# Patient Record
Sex: Female | Born: 1985 | Race: White | Hispanic: No | Marital: Single | Smoking: Current every day smoker
Health system: Southern US, Community
[De-identification: ages and names within clinical notes are randomized; demographics above are authoritative.]

---

## 2014-03-12 ENCOUNTER — Encounter (HOSPITAL_COMMUNITY): Payer: Self-pay | Admitting: Emergency Medicine

## 2014-03-12 ENCOUNTER — Emergency Department (HOSPITAL_COMMUNITY): Payer: 59

## 2014-03-12 ENCOUNTER — Emergency Department (HOSPITAL_COMMUNITY)
Admission: EM | Admit: 2014-03-12 | Discharge: 2014-03-12 | Disposition: A | Discharge status: Home / Self Care | Payer: 59 | Attending: Emergency Medicine | Admitting: Emergency Medicine

## 2014-03-12 DIAGNOSIS — H538 Other visual disturbances: Secondary | ICD-10-CM | POA: Diagnosis not present

## 2014-03-12 DIAGNOSIS — Z3202 Encounter for pregnancy test, result negative: Secondary | ICD-10-CM | POA: Insufficient documentation

## 2014-03-12 DIAGNOSIS — Z720 Tobacco use: Secondary | ICD-10-CM | POA: Insufficient documentation

## 2014-03-12 DIAGNOSIS — Z79899 Other long term (current) drug therapy: Secondary | ICD-10-CM | POA: Diagnosis not present

## 2014-03-12 DIAGNOSIS — R531 Weakness: Secondary | ICD-10-CM | POA: Diagnosis not present

## 2014-03-12 DIAGNOSIS — R42 Dizziness and giddiness: Secondary | ICD-10-CM | POA: Diagnosis present

## 2014-03-12 LAB — BASIC METABOLIC PANEL
Anion gap: 8 (ref 5–15)
BUN: 5 mg/dL — AB (ref 6–23)
CHLORIDE: 102 meq/L (ref 96–112)
CO2: 28 mmol/L (ref 19–32)
Calcium: 9.9 mg/dL (ref 8.4–10.5)
Creatinine, Ser: 0.58 mg/dL (ref 0.50–1.10)
GFR calc Af Amer: >90 mL/min (ref >90)
GFR calc non Af Amer: >90 mL/min (ref >90)
Glucose, Bld: 89 mg/dL (ref 70–99)
POTASSIUM: 3.3 mmol/L — AB (ref 3.5–5.1)
Sodium: 138 mmol/L (ref 135–145)

## 2014-03-12 LAB — CBC
HEMATOCRIT: 47.1 % — AB (ref 36.0–46.0)
Hemoglobin: 15.9 g/dL — ABNORMAL HIGH (ref 12.0–15.0)
MCH: 30.9 pg (ref 26.0–34.0)
MCHC: 33.8 g/dL (ref 30.0–36.0)
MCV: 91.5 fL (ref 78.0–100.0)
Platelets: 287 10*3/uL (ref 150–400)
RBC: 5.15 MIL/uL — ABNORMAL HIGH (ref 3.87–5.11)
RDW: 11.9 % (ref 11.5–15.5)
WBC: 8.2 10*3/uL (ref 4.0–10.5)

## 2014-03-12 LAB — POC URINE PREG, ED: Preg Test, Ur: NEGATIVE

## 2014-03-12 LAB — CBG MONITORING, ED: GLUCOSE-CAPILLARY: 76 mg/dL (ref 70–99)

## 2014-03-12 MED ORDER — SODIUM CHLORIDE 0.9 % IV BOLUS (SEPSIS)
1000.0000 mL | Freq: Once | INTRAVENOUS | Status: AC
  Administered 2014-03-12: 1000 mL via INTRAVENOUS

## 2014-03-12 NOTE — ED Provider Notes (Signed)
CSN: [id number]     Arrival date & time 03/12/14  2016 History   First MD Initiated Contact with Patient 03/12/14 2135     Chief Complaint  Patient presents with  . Dizziness  . Weakness     (Consider location/radiation/quality/duration/timing/severity/associated sxs/prior Treatment) HPI Caitlin Dunn is a 28 y.o. female with no significant past medical history who comes in for evaluation of generalized weakness. Patient states she has felt generally weak for the past week. She reports intermittent blurry vision that has been improving in the ED. She reports recently starting a new oral contraceptive and that her most recent period lasted 2 weeks, which is unusual for her. She reports the last day of her last menses was Christmas Eve. She denies any other symptoms at this time. There are no aggravating or relieving factors. No other modifying factors. Denies dizziness, headache, chest pain, shortness of breath, abdominal pain, numbness or tingling. Patient does smoke half a pack a day for the past 6 years. Denies alcohol or illicit drug use. Reports her primary care is Jacqualine CodeRachel Chapman  History reviewed. No pertinent past medical history. History reviewed. No pertinent past surgical history. History reviewed. No pertinent family history. History  Substance Use Topics  . Smoking status: Current Every Day Smoker  . Smokeless tobacco: Not on file  . Alcohol Use: Yes   OB History    No data available     Review of Systems  Constitutional: Negative for fever.  HENT: Negative for sore throat.   Eyes: Positive for visual disturbance.  Respiratory: Negative for shortness of breath.   Cardiovascular: Negative for chest pain.  Gastrointestinal: Negative for nausea, vomiting and abdominal pain.  Endocrine: Negative for polyuria.  Genitourinary: Negative for dysuria.  Skin: Negative for rash.  Neurological: Positive for weakness. Negative for headaches.      Allergies  Review of  patient's allergies indicates no known allergies.  Home Medications   Prior to Admission medications   Medication Sig Start Date End Date Taking? Authorizing Provider  amphetamine-dextroamphetamine (ADDERALL XR) 20 MG 24 hr capsule Take 20 mg by mouth daily.   Yes Historical Provider, MD  Norgestimate-Ethinyl Estradiol Triphasic (ORTHO TRI-CYCLEN, 28,) 0.18/0.215/0.25 MG-35 MCG tablet Take 1 tablet by mouth daily.   Yes Historical Provider, MD  amphetamine-dextroamphetamine (ADDERALL XR) 25 MG 24 hr capsule Take 25 mg by mouth every morning.    Historical Provider, MD   BP 102/70 mmHg  Pulse 63  Temp(Src) 98 F (36.7 C) (Oral)  Resp 16  SpO2 100%  LMP 03/05/2014 (Exact Date) Physical Exam  Constitutional: She is oriented to person, place, and time. She appears well-developed and well-nourished.  HENT:  Head: Normocephalic and atraumatic.  Mouth/Throat: Oropharynx is clear and moist.  Eyes: Conjunctivae are normal. Pupils are equal, round, and reactive to light. Right eye exhibits no discharge. Left eye exhibits no discharge. No scleral icterus.  Neck: Neck supple.  Cardiovascular: Normal rate, regular rhythm and normal heart sounds.   Pulmonary/Chest: Effort normal and breath sounds normal. No respiratory distress. She has no wheezes. She has no rales.  Abdominal: Soft. There is no tenderness.  Musculoskeletal: Normal range of motion. She exhibits no edema or tenderness.  Neurological: She is alert and oriented to person, place, and time.  Cranial Nerves II-XII grossly intact. Motor and sensation 5/5 in all 4 extremities. No nystagmus. Patient completes finger to nose with no difficulty Gait appears baseline with no appreciable ataxia.  Skin: Skin is warm and dry.  No rash noted.  Psychiatric: She has a normal mood and affect.  Nursing note and vitals reviewed.   ED Course  Procedures (including critical care time) Labs Review Labs Reviewed  CBC - Abnormal; Notable for the  following:    RBC 5.15 (*)    Hemoglobin 15.9 (*)    HCT 47.1 (*)    All other components within normal limits  BASIC METABOLIC PANEL - Abnormal; Notable for the following:    Potassium 3.3 (*)    BUN 5 (*)    All other components within normal limits  CBG MONITORING, ED  POC URINE PREG, ED    Imaging Review Ct Head Wo Contrast  03/12/2014   CLINICAL DATA:  Headache, intermittent dizziness  EXAM: CT HEAD WITHOUT CONTRAST  TECHNIQUE: Contiguous axial images were obtained from the base of the skull through the vertex without intravenous contrast.  COMPARISON:  None.  FINDINGS: No evidence of parenchymal hemorrhage or extra-axial fluid collection. No mass lesion, mass effect, or midline shift.  No CT evidence of acute infarction.  Cerebral volume is within normal limits.  No ventriculomegaly.  Partial opacification of the bilateral ethmoid sinuses. The mastoid air cells are unopacified.  No evidence of calvarial fracture.  IMPRESSION: Normal head CT.   Electronically Signed   By: Charline BillsSriyesh  Krishnan M.D.   On: 03/12/2014 23:10     EKG Interpretation None       Date: 03/12/2014  Rate: 87  Rhythm: normal sinus rhythm  QRS Axis: normal  Intervals: normal  ST/T Wave abnormalities: normal  Conduction Disutrbances:none  Narrative Interpretation:   Old EKG Reviewed: none available   Meds given in ED:  Medications  sodium chloride 0.9 % bolus 1,000 mL (1,000 mLs Intravenous New Bag/Given 03/12/14 2251)    New Prescriptions   No medications on file   Filed Vitals:   03/12/14 2030 03/12/14 2324  BP: 129/97 102/70  Pulse: 86 63  Temp: 98 F (36.7 C)   TempSrc: Oral   Resp: 18 16  SpO2: 100% 100%    MDM  Vitals stable - WNL -afebrile Pt resting comfortably in ED. states she feels better since receiving fluids in the ED. PE--not concerning further acute or emergent pathology. Normal neuro exam. Patient gait without any ataxia. Labwork noncontributory. EKG not  concerning Imaging--normal head CT. DDX--no apparent emergent cause for fatigue symptoms, no evidence of anemia, no evidence of central lesion  Discussed f/u with PCP and return precautions, pt very amenable to plan. Prior to patient discharge, I discussed and reviewed this case with Dr.Zammit Patient stable, in good condition and is appropriate for discharge  Final diagnoses:  Weakness        Sharlene MottsBenjamin W Wilberth Damon, PA-C 03/12/14 2344  Benny LennertJoseph L Zammit, MD 03/13/14 [phone]

## 2014-03-12 NOTE — ED Notes (Signed)
Pt arrived to the ED with a complaint of dizziness and weakness for several days.  [facility] here due to vision changes and abnormalities.  Pt is unbarred to see more that three feet.  Pt states today she was unable to lift her head or open her eyes due to weakness.

## 2014-03-12 NOTE — Discharge Instructions (Signed)
Fatigue Fatigue is a feeling of tiredness, lack of energy, lack of motivation, or feeling tired all the time. Having enough rest, good nutrition, and reducing stress will normally reduce fatigue. Consult your caregiver if it persists. The nature of your fatigue will help your caregiver to find out its cause. The treatment is based on the cause.  CAUSES  There are many causes for fatigue. Most of the time, fatigue can be traced to one or more of your habits or routines. Most causes fit into one or more of three general areas. They are: Lifestyle problems  Sleep disturbances.  Overwork.  Physical exertion.  Unhealthy habits.  Poor eating habits or eating disorders.  Alcohol and/or drug use .  Lack of proper nutrition (malnutrition). Psychological problems  Stress and/or anxiety problems.  Depression.  Grief.  Boredom. Medical Problems or Conditions  Anemia.  Pregnancy.  Thyroid gland problems.  Recovery from major surgery.  Continuous pain.  Emphysema or asthma that is not well controlled  Allergic conditions.  Diabetes.  Infections (such as mononucleosis).  Obesity.  Sleep disorders, such as sleep apnea.  Heart failure or other heart-related problems.  Cancer.  Kidney disease.  Liver disease.  Effects of certain medicines such as antihistamines, cough and cold remedies, prescription pain medicines, heart and blood pressure medicines, drugs used for treatment of cancer, and some antidepressants. SYMPTOMS  The symptoms of fatigue include:   Lack of energy.  Lack of drive (motivation).  Drowsiness.  Feeling of indifference to the surroundings. DIAGNOSIS  The details of how you feel help guide your caregiver in finding out what is causing the fatigue. You will be asked about your present and past health condition. It is important to review all medicines that you take, including prescription and non-prescription items. A thorough exam will be done.  You will be questioned about your feelings, habits, and normal lifestyle. Your caregiver may suggest blood tests, urine tests, or other tests to look for common medical causes of fatigue.  TREATMENT  Fatigue is treated by correcting the underlying cause. For example, if you have continuous pain or depression, treating these causes will improve how you feel. Similarly, adjusting the dose of certain medicines will help in reducing fatigue.  HOME CARE INSTRUCTIONS   Try to get the required amount of good sleep every night.  Eat a healthy and nutritious diet, and drink enough water throughout the day.  Practice ways of relaxing (including yoga or meditation).  Exercise regularly.  Make plans to change situations that cause stress. Act on those plans so that stresses decrease over time. Keep your work and personal routine reasonable.  Avoid street drugs and minimize use of alcohol.  Start taking a daily multivitamin after consulting your caregiver. SEEK MEDICAL CARE IF:   You have persistent tiredness, which cannot be accounted for.  You have fever.  You have unintentional weight loss.  You have headaches.  You have disturbed sleep throughout the night.  You are feeling sad.  You have constipation.  You have dry skin.  You have gained weight.  You are taking any new or different medicines that you suspect are causing fatigue.  You are unable to sleep at night.  You develop any unusual swelling of your legs or other parts of your body. SEEK IMMEDIATE MEDICAL CARE IF:   You are feeling confused.  Your vision is blurred.  You feel faint or pass out.  You develop severe headache.  You develop severe abdominal, pelvic, or  back pain.  You develop chest pain, shortness of breath, or an irregular or fast heartbeat.  You are unable to pass a normal amount of urine.  You develop abnormal bleeding such as bleeding from the rectum or you vomit blood.  You have thoughts  about harming yourself or committing suicide.  You are worried that you might harm someone else. MAKE SURE YOU:   Understand these instructions.  Will watch your condition.  Will get help right away if you are not doing well or get worse. Document Released: 12/28/2006 Document Revised: 05/25/2011 Document Reviewed: 07/04/2013 [facility] Patient Information 2015 [employer] [place]. This information is not intended to replace advice given to you by your health care provider. Make sure you discuss any questions you have with your health care provider.   You were evaluated in the ED today for your generalized weakness. There does not appear to be an emergent cause for your symptoms at this time. It is important to follow up with primary care for further evaluation and management of your symptoms. Return to ED for worsening symptoms, headaches, chest pain, shortness of breath, loss of consciousness.

## 2014-10-16 ENCOUNTER — Emergency Department (HOSPITAL_COMMUNITY)
Admission: EM | Admit: 2014-10-16 | Discharge: 2014-10-16 | Disposition: A | Discharge status: Home / Self Care | Payer: 59 | Attending: Emergency Medicine | Admitting: Emergency Medicine

## 2014-10-16 ENCOUNTER — Encounter (HOSPITAL_COMMUNITY): Payer: Self-pay | Admitting: Emergency Medicine

## 2014-10-16 DIAGNOSIS — Z3202 Encounter for pregnancy test, result negative: Secondary | ICD-10-CM | POA: Diagnosis not present

## 2014-10-16 DIAGNOSIS — Z720 Tobacco use: Secondary | ICD-10-CM | POA: Diagnosis not present

## 2014-10-16 DIAGNOSIS — R109 Unspecified abdominal pain: Secondary | ICD-10-CM | POA: Diagnosis present

## 2014-10-16 DIAGNOSIS — N390 Urinary tract infection, site not specified: Secondary | ICD-10-CM | POA: Insufficient documentation

## 2014-10-16 LAB — URINE MICROSCOPIC-ADD ON

## 2014-10-16 LAB — COMPREHENSIVE METABOLIC PANEL
ALT: 13 U/L — ABNORMAL LOW (ref 14–54)
AST: 19 U/L (ref 15–41)
Albumin: 3.9 g/dL (ref 3.5–5.0)
Alkaline Phosphatase: 51 U/L (ref 38–126)
Anion gap: 10 (ref 5–15)
BILIRUBIN TOTAL: 0.6 mg/dL (ref 0.3–1.2)
BUN: 6 mg/dL (ref 6–20)
CHLORIDE: 99 mmol/L — AB (ref 101–111)
CO2: 26 mmol/L (ref 22–32)
Calcium: 9.1 mg/dL (ref 8.9–10.3)
Creatinine, Ser: 0.73 mg/dL (ref 0.44–1.00)
GFR calc non Af Amer: >60 mL/min (ref >60)
Glucose, Bld: 112 mg/dL — ABNORMAL HIGH (ref 65–99)
Potassium: 3.5 mmol/L (ref 3.5–5.1)
Sodium: 135 mmol/L (ref 135–145)
Total Protein: 7.4 g/dL (ref 6.5–8.1)

## 2014-10-16 LAB — CBC WITH DIFFERENTIAL/PLATELET
Basophils Absolute: 0 10*3/uL (ref 0.0–0.1)
Basophils Relative: 0 % (ref 0–1)
Eosinophils Absolute: 0 10*3/uL (ref 0.0–0.7)
Eosinophils Relative: 0 % (ref 0–5)
HCT: 41.3 % (ref 36.0–46.0)
Hemoglobin: 14.4 g/dL (ref 12.0–15.0)
Lymphocytes Relative: 6 % — ABNORMAL LOW (ref 12–46)
Lymphs Abs: 0.7 10*3/uL (ref 0.7–4.0)
MCH: 30.4 pg (ref 26.0–34.0)
MCHC: 34.9 g/dL (ref 30.0–36.0)
MCV: 87.3 fL (ref 78.0–100.0)
MONO ABS: 1.1 10*3/uL — AB (ref 0.1–1.0)
MONOS PCT: 9 % (ref 3–12)
NEUTROS ABS: 10.8 10*3/uL — AB (ref 1.7–7.7)
NEUTROS PCT: 85 % — AB (ref 43–77)
PLATELETS: 201 10*3/uL (ref 150–400)
RBC: 4.73 MIL/uL (ref 3.87–5.11)
RDW: 12.2 % (ref 11.5–15.5)
WBC: 12.7 10*3/uL — ABNORMAL HIGH (ref 4.0–10.5)

## 2014-10-16 LAB — URINALYSIS, ROUTINE W REFLEX MICROSCOPIC
BILIRUBIN URINE: NEGATIVE
Glucose, UA: NEGATIVE mg/dL
Ketones, ur: 15 mg/dL — AB
Nitrite: POSITIVE — AB
PH: 6 (ref 5.0–8.0)
Protein, ur: 100 mg/dL — AB
Specific Gravity, Urine: 1.026 (ref 1.005–1.030)
Urobilinogen, UA: 0.2 mg/dL (ref 0.0–1.0)

## 2014-10-16 LAB — I-STAT CG4 LACTIC ACID, ED
LACTIC ACID, VENOUS: 0.62 mmol/L (ref 0.5–2.0)
Lactic Acid, Venous: 1.38 mmol/L (ref 0.5–2.0)

## 2014-10-16 LAB — POC URINE PREG, ED: Preg Test, Ur: NEGATIVE

## 2014-10-16 LAB — LIPASE, BLOOD: Lipase: 25 U/L (ref 22–51)

## 2014-10-16 MED ORDER — CIPROFLOXACIN HCL 500 MG PO TABS: 500.0000 mg | ORAL_TABLET | Freq: Two times a day (BID) | ORAL | Status: AC

## 2014-10-16 MED ORDER — SODIUM CHLORIDE 0.9 % IV BOLUS (SEPSIS)
1000.0000 mL | Freq: Once | INTRAVENOUS | Status: AC
  Administered 2014-10-16: 1000 mL via INTRAVENOUS

## 2014-10-16 MED ORDER — ONDANSETRON 4 MG PO TBDP: 4.0000 mg | ORAL_TABLET | Freq: Three times a day (TID) | ORAL | Status: AC | PRN

## 2014-10-16 MED ORDER — DEXTROSE 5 % IV SOLN
1.0000 g | Freq: Once | INTRAVENOUS | Status: AC
  Administered 2014-10-16: 1 g via INTRAVENOUS
  Filled 2014-10-16: qty 10

## 2014-10-16 NOTE — ED Provider Notes (Signed)
History   Chief Complaint  Patient presents with  . Flank Pain  . Fever    HPI 29 year old female past medical history as below who presents to ED for dysuria, right flank pain, fever for the past few days. Patient reports she is also nauseous but denies any vomiting. She states she has history of UTIs in the past and has prior kidney infection many years ago. Patient states she is still tolerating by mouth well. She denies any abdominal pain elsewhere. Denies any vaginal bleeding or vaginal discharge. Denies any pelvic or vaginal pain. No history of kidney stones. No treatments tried at home. Once it was gradual. No modifying factors. Severity is mild to moderate. No other associated symptoms. No other complaints at this time.  Past medical/surgical history, social history, medications, allergies and FH have been reviewed with patient and/or in documentation. Furthermore, if pt family or friend(s) present, additional historical information was obtained from them.  History reviewed. No pertinent past medical history. History reviewed. No pertinent past surgical history. History reviewed. No pertinent family history. History  Substance Use Topics  . Smoking status: Current Every Day Smoker  . Smokeless tobacco: Not on file  . Alcohol Use: Yes     Review of Systems Constitutional: + F/C, fatigue.  HENT: - congestion, -rhinorrhea, -sore throat.   Eyes: - eye pain, -visual disturbance.  Respiratory: - cough, -SOB, -hemoptysis.   Cardiovascular: - CP, -palps.  Gastrointestinal: - N/V/D, -abd pain  Genitourinary: + flank pain, +dysuria, +frequency.  Musculoskeletal: - myalgia/arthritis, -joint swelling, -gait abnormality, -back pain, -neck pain/stiffness, -leg pain/swelling.  Skin: - rash/lesion.  Neurological: - focal weakness, -lightheadedness, -dizziness, -numbness, -HA.  All other systems reviewed and are negative.   Physical Exam  Physical Exam  ED Triage Vitals  Enc Vitals  Group     BP 10/16/14 1733 108/65 mmHg     Pulse Rate 10/16/14 1733 133     Resp 10/16/14 1733 20     Temp 10/16/14 1733 101.7 F (38.7 C)     Temp Source 10/16/14 1733 Oral     SpO2 10/16/14 1733 99 %     Weight 10/16/14 1733 138 lb (62.596 kg)     Height --      Head Cir --      Peak Flow --      Pain Score 10/16/14 1733 8     Pain Loc --      Pain Edu? --      Excl. in GC? --    Filed Vitals:   10/16/14 1733 10/16/14 2027 10/16/14 2045  BP: 108/65 108/59 118/74  Pulse: 133 83 83  Temp: 101.7 F (38.7 C) 99.1 F (37.3 C)   TempSrc: Oral Oral   Resp: 20 18   Weight: 138 lb (62.596 kg)    SpO2: 99% 99% 100%    Constitutional: Patient is well appearing and in no acute distress Head: Normocephalic and atraumatic.  Eyes: Extraocular motion intact, no scleral icterus Mouth: MMM, OP clear Neck: Supple without meningismus, mass, or overt JVD Respiratory: No respiratory distress. Normal WOB. No w/r/g. CV: RRR, no obvious murmurs.  Pulses +2 and symmetric. Euvolemic Abdomen: Soft, NT, ND, no r/g. No mass.  MSK: Extremities are atraumatic without deformity, ROM intact Skin: Warm, dry, intact without rash Neuro: AAOx4, MAE 5/5 sym, no focal deficit noted   ED Course  Procedures   Labs Reviewed  COMPREHENSIVE METABOLIC PANEL - Abnormal; Notable for the following:  Chloride 99 (*)    Glucose, Bld 112 (*)    ALT 13 (*)    All other components within normal limits  URINALYSIS, ROUTINE W REFLEX MICROSCOPIC (NOT AT [facility]) - Abnormal; Notable for the following:    Color, Urine AMBER (*)    APPearance TURBID (*)    Hgb urine dipstick MODERATE (*)    Ketones, ur 15 (*)    Protein, ur 100 (*)    Nitrite POSITIVE (*)    Leukocytes, UA SMALL (*)    All other components within normal limits  CBC WITH DIFFERENTIAL/PLATELET - Abnormal; Notable for the following:    WBC 12.7 (*)    Neutrophils Relative % 85 (*)    Neutro Abs 10.8 (*)    Lymphocytes Relative 6 (*)     Monocytes Absolute 1.1 (*)    All other components within normal limits  URINE MICROSCOPIC-ADD ON - Abnormal; Notable for the following:    Squamous Epithelial / LPF MANY (*)    Bacteria, UA MANY (*)    All other components within normal limits  URINE CULTURE  LIPASE, BLOOD  POC URINE PREG, ED  I-STAT CG4 LACTIC ACID, ED  I-STAT CG4 LACTIC ACID, ED   I personally reviewed and interpreted all labs.  No orders to display   I personally viewed above image(s) which were used in my medical decision making. Formal interpretations by Radiology.   EKG Interpretation  Date/Time:    Ventricular Rate:    PR Interval:    QRS Duration:   QT Interval:    QTC Calculation:   R Axis:     Text Interpretation:         MDM: Danity Schmelzer is a 29 y.o. female with H&P as above who p/w CC: UTI, flank pain.  Patient arrives and is well appearing, admitted mechanically stable and in no apparent distress. Patient is a benign exam as above. Presumed UTI/mild pyelo-. Patient's screening labs notable for mild leukocytosis without evidence of kidney failure. Patient received normal saline and IV Rocephin in ED. Patient was reevaluated and appears to be stable for discharge given benign nature of symptoms. Patient is a PCP patient and follow closely with as needed. Patient stable for discharge.  Old records reviewed (if available). Labs and imaging reviewed personally by myself and considered in medical decision making if ordered.  Clinical Impression: 1. UTI (lower urinary tract infection)     Disposition: Discharge  Condition: Good  I have discussed the results, Dx and Tx plan with the pt(& family if present). He/she/they expressed understanding and agree(s) with the plan. Discharge instructions discussed at great length. Strict return precautions discussed and pt &/or family have verbalized understanding of the instructions. No further questions at time of discharge.    New Prescriptions    CIPROFLOXACIN (CIPRO) 500 MG TABLET    Take 1 tablet (500 mg total) by mouth 2 (two) times daily.   ONDANSETRON (ZOFRAN ODT) 4 MG DISINTEGRATING TABLET    Take 1 tablet (4 mg total) by mouth every 8 (eight) hours as needed for nausea or vomiting.    Follow Up: [facility]     [address] [place] [address] [phone]  To establish primary care, call above     Follow up with your PCP if not improved in 2 days  [facility] EMERGENCY DEPARTMENT [address] [id number] mc [place] [address] [phone]  If symptoms worsen  Pt seen in conjunction with Dr. Nelva Nay, MD  Ames Dura, DO [facility] Emergency Medicine Resident - PGY-3     Ames Dura, MD 10/16/14 1610  Nelva Nay, MD 10/23/14 [phone]

## 2014-10-16 NOTE — Discharge Instructions (Signed)

## 2014-10-16 NOTE — ED Notes (Addendum)
Pt c/o right flank pain x 2 days with fever; pt sts hx of kidney infection and sts thinks is same; pt took tylenol just prior to arrival

## 2014-10-19 LAB — URINE CULTURE: Culture: >=100000

## 2014-10-21 ENCOUNTER — Telehealth (HOSPITAL_COMMUNITY): Payer: Self-pay

## 2014-10-21 NOTE — Telephone Encounter (Signed)
Post ED Visit - Positive Culture Follow-up  Culture report reviewed by antimicrobial stewardship pharmacist:  Wes Dulaney, Pharm.D., BCPS  Celedonio Miyamoto, Pharm.D., BCPS  Georgina Pillion, Pharm.D., BCPS  [place], [place].D., BCPS, AAHIVP  Estella Husk, Pharm.D., BCPS, AAHIVP  Elder Cyphers, [address].D., BCPS  Positive Urine culture>/= 100,000 colonies -> E Coli Treated with Ciprofloxacin, organism sensitive to the same and no further patient follow-up is required at this time.  Arvid Right 10/21/2014, 5:06 AM

## 2016-08-01 IMAGING — CT CT HEAD W/O CM
2 series · 17 of 30 positions shown, 20 images · non-contrast
Comparison: None.

CLINICAL DATA: Headache, intermittent dizziness

EXAM:
CT HEAD WITHOUT CONTRAST
TECHNIQUE: Contiguous axial images were obtained from the base of the skull
through the vertex without intravenous contrast.

[Series 2: head w/o · axial · non-contrast · 0.41mm/px · z∈[-155,-35]mm · 9 of 31 slices shown, 12 images]
[im 4/31  brain]
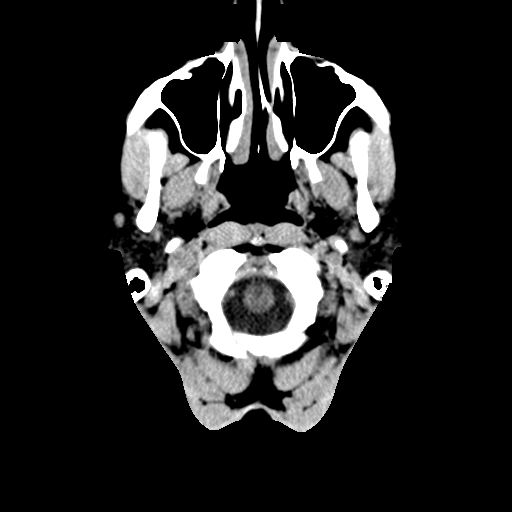
[im 4/31  bone]
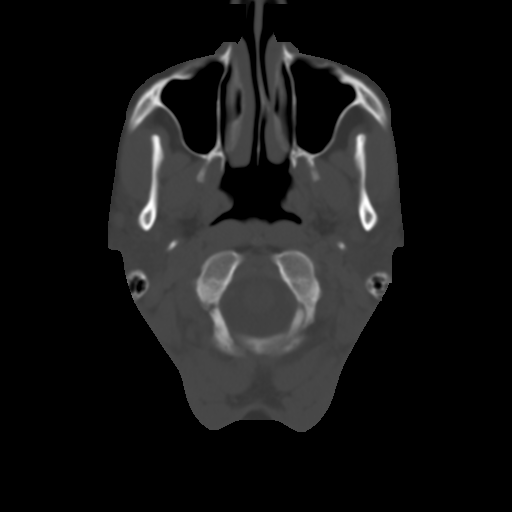
[im 7/31  brain]
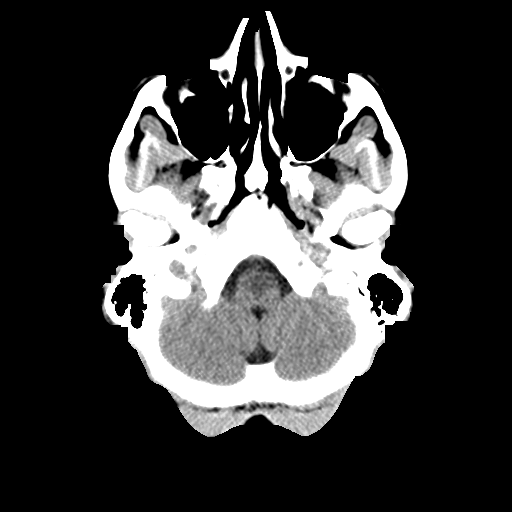
[im 10/31  brain]
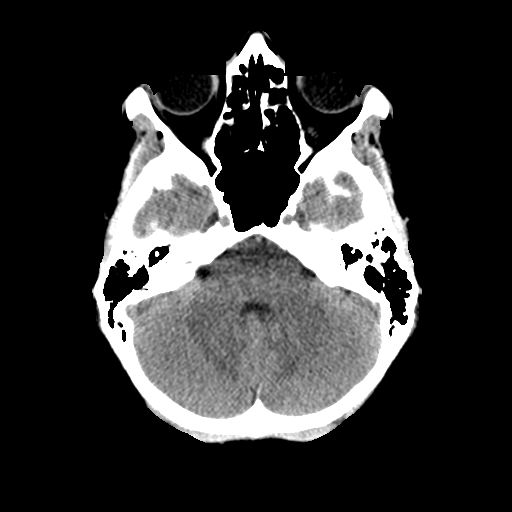
[im 13/31  brain]
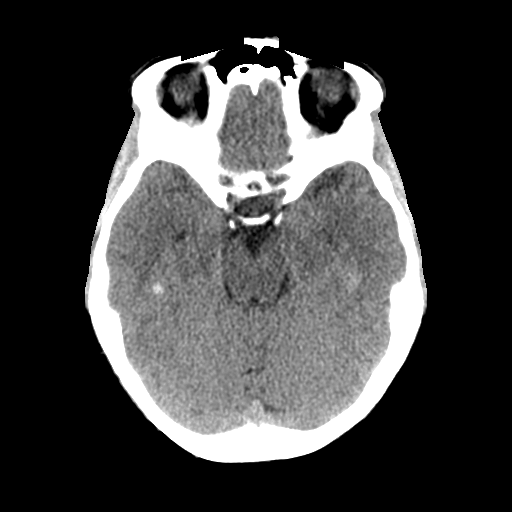
[im 16/31  brain]
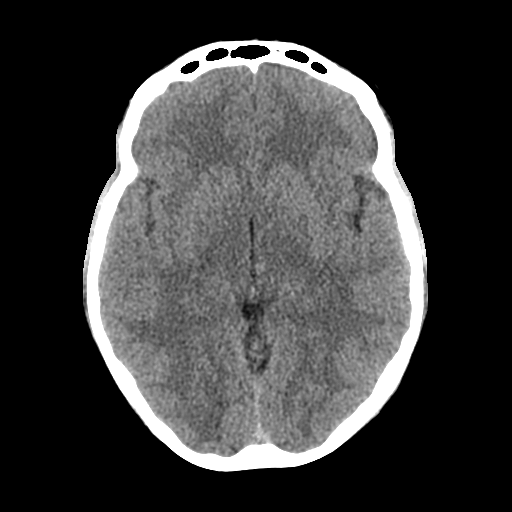
[im 16/31  bone]
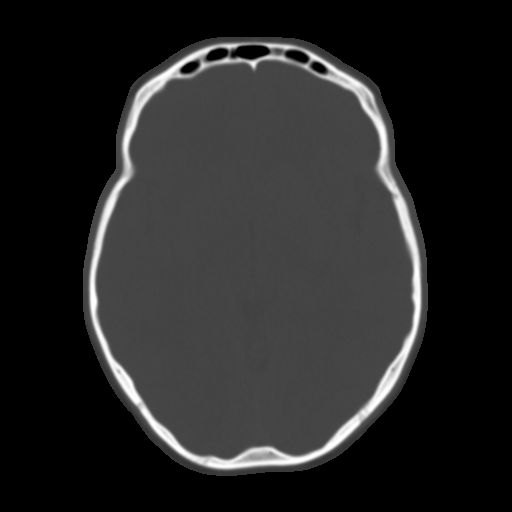
[im 19/31  brain]
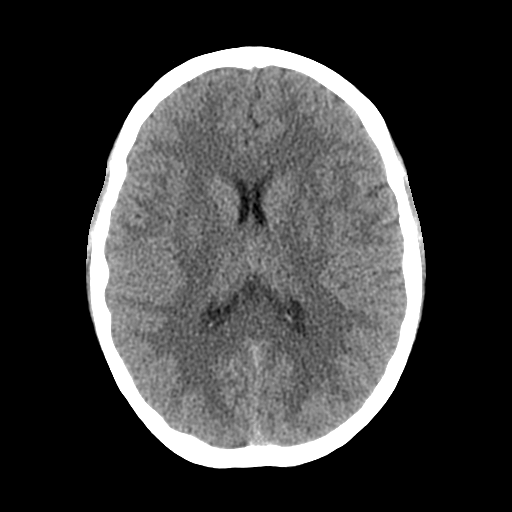
[im 22/31  brain]
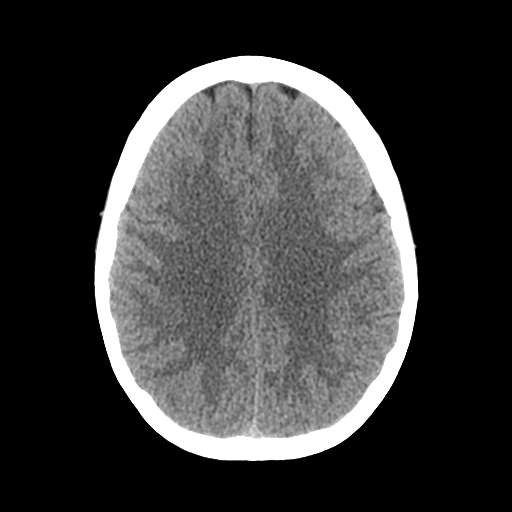
[im 25/31  brain]
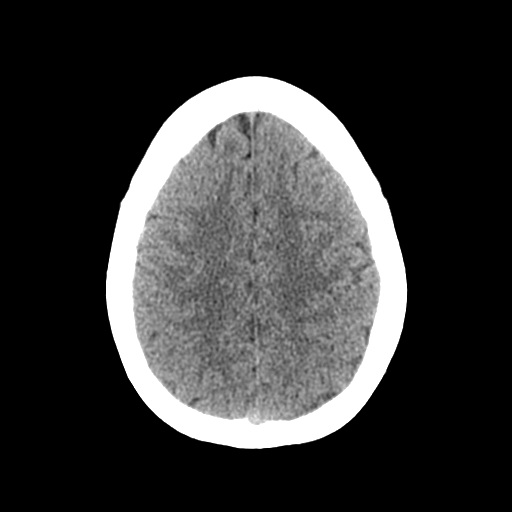
[im 28/31  brain]
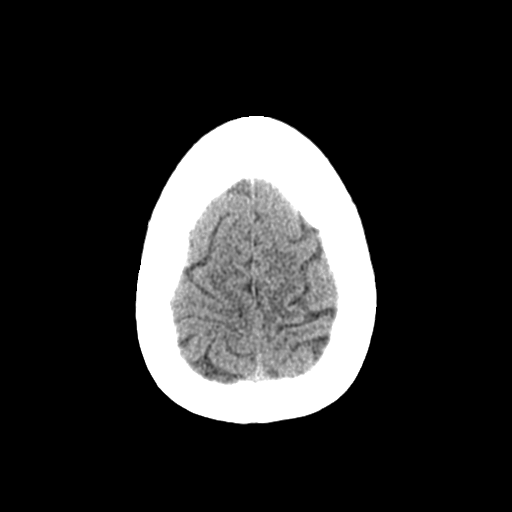
[im 28/31  bone]
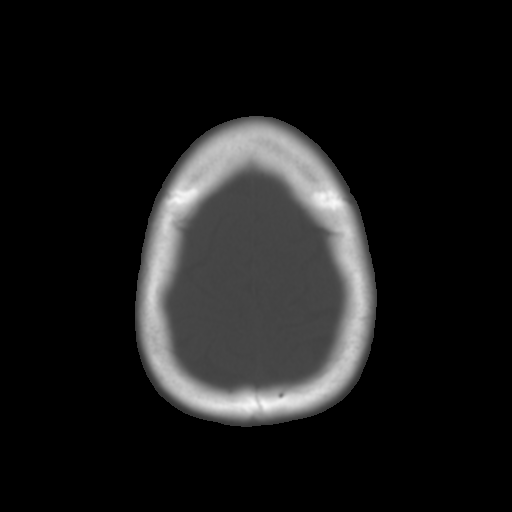

[Series 3: bone windows · axial · 0.41mm/px · z∈[-155,-38]mm · 8 of 51 slices shown]
[im 6/51  bone]
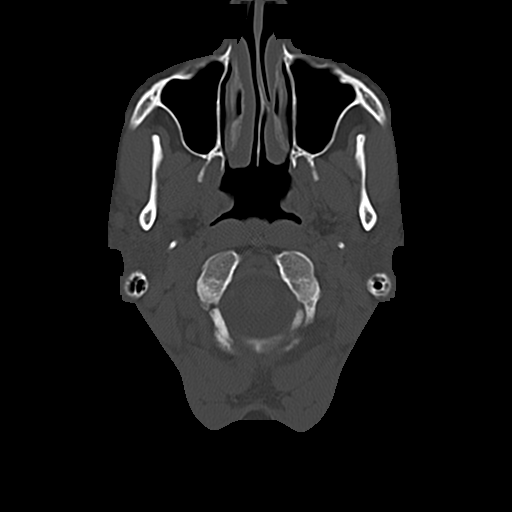
[im 12/51  bone]
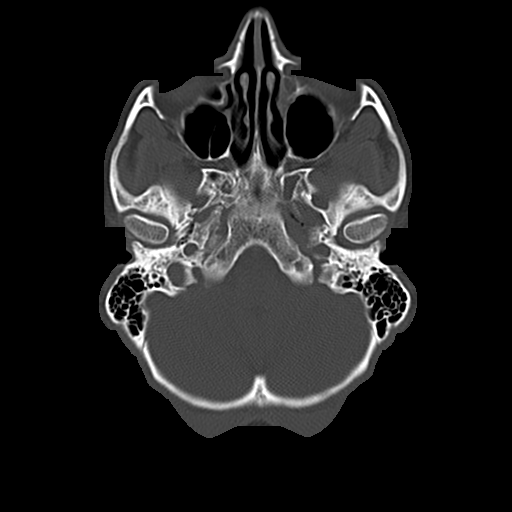
[im 17/51  bone]
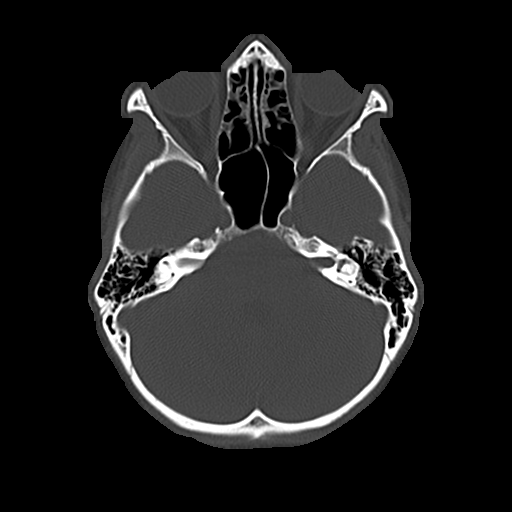
[im 23/51  bone]
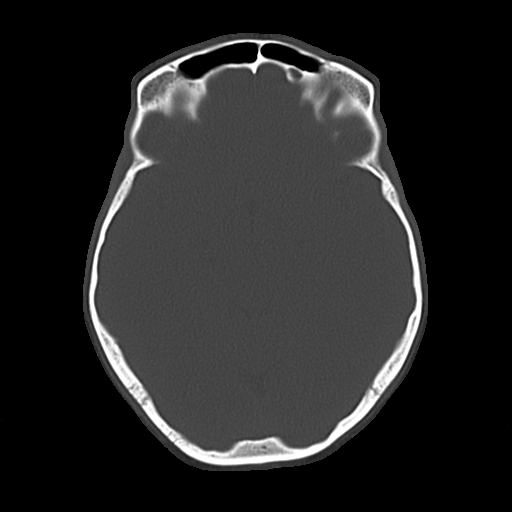
[im 28/51  bone]
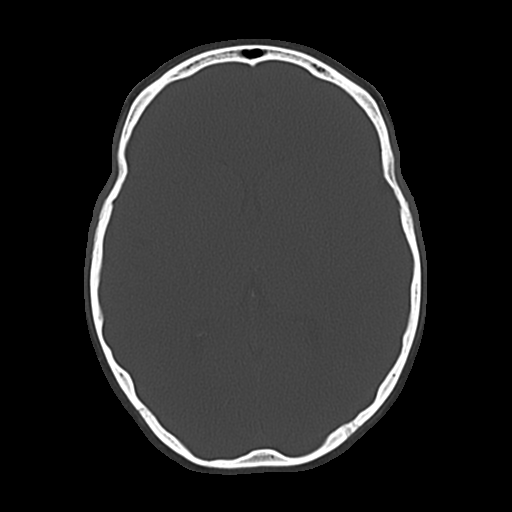
[im 34/51  bone]
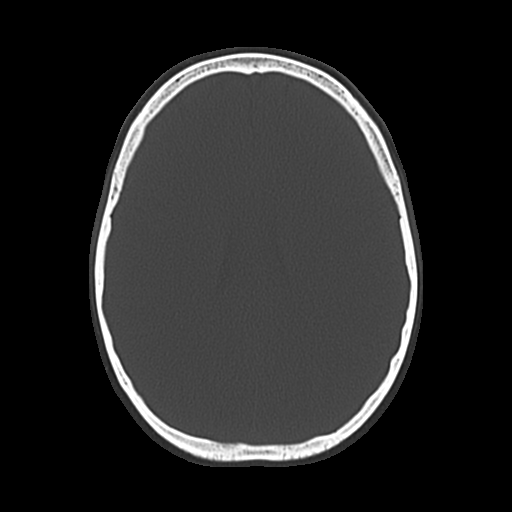
[im 39/51  bone]
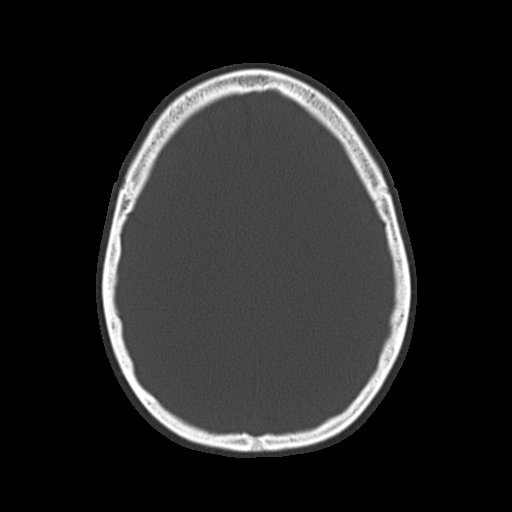
[im 45/51  bone]
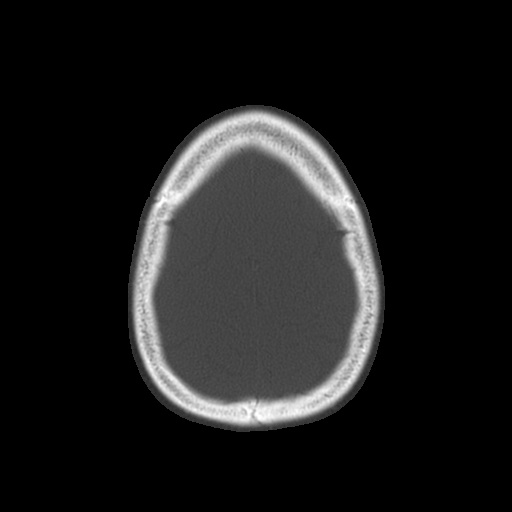

[17 of 30 positions shown; findings below may reference images not displayed]

FINDINGS: No evidence of parenchymal hemorrhage or extra-axial fluid
collection. No mass lesion, mass effect, or midline shift.

No CT evidence of acute infarction.

Cerebral volume is within normal limits.  No ventriculomegaly.

Partial opacification of the bilateral ethmoid sinuses. The mastoid
air cells are unopacified.

No evidence of calvarial fracture.
IMPRESSION: Normal head CT.
